# Patient Record
Sex: Male | Born: 1995
Health system: Southern US, Community
[De-identification: ages and names within clinical notes are randomized; demographics above are authoritative.]

## PROBLEM LIST (undated history)

## (undated) DIAGNOSIS — F419 Anxiety disorder, unspecified: Secondary | ICD-10-CM

## (undated) HISTORY — PX: OTHER SURGICAL HISTORY: SHX169

## (undated) HISTORY — PX: TOOTH EXTRACTION: SUR596

---
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
## 2016-06-10 DIAGNOSIS — J069 Acute upper respiratory infection, unspecified: Secondary | ICD-10-CM | POA: Diagnosis not present

## 2016-06-10 DIAGNOSIS — J020 Streptococcal pharyngitis: Secondary | ICD-10-CM | POA: Diagnosis not present

## 2016-07-13 DIAGNOSIS — Z0001 Encounter for general adult medical examination with abnormal findings: Secondary | ICD-10-CM | POA: Diagnosis not present

## 2017-03-04 ENCOUNTER — Emergency Department (HOSPITAL_COMMUNITY): Payer: 59

## 2017-03-04 ENCOUNTER — Other Ambulatory Visit: Payer: Self-pay

## 2017-03-04 ENCOUNTER — Encounter (HOSPITAL_COMMUNITY): Payer: Self-pay

## 2017-03-04 ENCOUNTER — Emergency Department (HOSPITAL_COMMUNITY)
Admission: EM | Admit: 2017-03-04 | Discharge: 2017-03-04 | Disposition: A | Discharge status: Home / Self Care | Payer: 59 | Attending: Emergency Medicine | Admitting: Emergency Medicine

## 2017-03-04 DIAGNOSIS — L03311 Cellulitis of abdominal wall: Secondary | ICD-10-CM | POA: Diagnosis not present

## 2017-03-04 DIAGNOSIS — K429 Umbilical hernia without obstruction or gangrene: Secondary | ICD-10-CM | POA: Diagnosis not present

## 2017-03-04 DIAGNOSIS — R198 Other specified symptoms and signs involving the digestive system and abdomen: Secondary | ICD-10-CM | POA: Diagnosis not present

## 2017-03-04 DIAGNOSIS — K420 Umbilical hernia with obstruction, without gangrene: Secondary | ICD-10-CM | POA: Diagnosis not present

## 2017-03-04 DIAGNOSIS — R1033 Periumbilical pain: Secondary | ICD-10-CM | POA: Diagnosis not present

## 2017-03-04 LAB — COMPREHENSIVE METABOLIC PANEL
ALT: 46 U/L (ref 17–63)
AST: 30 U/L (ref 15–41)
Albumin: 4.9 g/dL (ref 3.5–5.0)
Alkaline Phosphatase: 62 U/L (ref 38–126)
Anion gap: 9 (ref 5–15)
BUN: 14 mg/dL (ref 6–20)
CO2: 27 mmol/L (ref 22–32)
Calcium: 9.7 mg/dL (ref 8.9–10.3)
Chloride: 101 mmol/L (ref 101–111)
Creatinine, Ser: 0.9 mg/dL (ref 0.61–1.24)
GFR calc Af Amer: >60 mL/min (ref >60)
GFR calc non Af Amer: >60 mL/min (ref >60)
Glucose, Bld: 103 mg/dL — ABNORMAL HIGH (ref 65–99)
Potassium: 4.2 mmol/L (ref 3.5–5.1)
Sodium: 137 mmol/L (ref 135–145)
Total Bilirubin: 0.4 mg/dL (ref 0.3–1.2)
Total Protein: 8 g/dL (ref 6.5–8.1)

## 2017-03-04 LAB — CBC
HCT: 46.3 % (ref 39.0–52.0)
Hemoglobin: 16 g/dL (ref 13.0–17.0)
MCH: 29.3 pg (ref 26.0–34.0)
MCHC: 34.6 g/dL (ref 30.0–36.0)
MCV: 84.6 fL (ref 78.0–100.0)
Platelets: 286 10*3/uL (ref 150–400)
RBC: 5.47 MIL/uL (ref 4.22–5.81)
RDW: 12.1 % (ref 11.5–15.5)
WBC: 9.3 10*3/uL (ref 4.0–10.5)

## 2017-03-04 LAB — LIPASE, BLOOD: LIPASE: 21 U/L (ref 11–51)

## 2017-03-04 MED ORDER — IOPAMIDOL (ISOVUE-300) INJECTION 61%
INTRAVENOUS | Status: AC
  Filled 2017-03-04: qty 100

## 2017-03-04 MED ORDER — IOPAMIDOL (ISOVUE-300) INJECTION 61%
100.0000 mL | Freq: Once | INTRAVENOUS | Status: AC | PRN
  Administered 2017-03-04: 100 mL via INTRAVENOUS

## 2017-03-04 MED ORDER — SULFAMETHOXAZOLE-TRIMETHOPRIM 800-160 MG PO TABS: 1.0000 | ORAL_TABLET | Freq: Two times a day (BID) | ORAL | 0 refills | Status: AC

## 2017-03-04 NOTE — ED Provider Notes (Signed)
[facility]-EMERGENCY DEPT Provider Note   CSN: [id number] Arrival date & time: 03/04/17  1742     History   Chief Complaint Chief Complaint  Patient presents with  . Abdominal Pain    HPI Logan Ramos is a 21 y.o. male who presents to the emergency department with a chief complaint of peri-umbilical pain that began 3 days ago that is aggravated with sitting up, coughing, and palpation. He reports purulent drainage from the belly button that began tonight [facility]. He reports his symptoms are improved with rest and staying in the same position. He denies fever, chills, N/V/D, back pain, dysuria, frequency, urgency, penile discharge or swelling. No h/o of abdominal surgery. No h/o of a hernia.   He reports frequent coughing over the last few weeks prior to the onset of symptoms.  The history is provided by the patient. No language interpreter was used.  Abdominal Pain   This is a new problem. The current episode started more than 2 days ago. The problem occurs constantly. The problem has been gradually worsening. The pain is associated with an unknown factor. The pain is located in the periumbilical region. Pertinent negatives include fever, diarrhea, nausea, vomiting, constipation, dysuria, frequency and hematuria. The symptoms are aggravated by certain positions and coughing. The symptoms are relieved by certain positions and being still. Past workup does not include GI consult, CT scan, ultrasound, surgery or barium enema. His past medical history does not include PUD, gallstones, GERD, ulcerative colitis, Crohn's disease or irritable bowel syndrome.    History reviewed. No pertinent past medical history.  There are no active problems to display for this patient.   Past Surgical History:  Procedure Laterality Date  . wisdon teeth extraction         Home Medications    Prior to Admission medications   Medication Sig Start Date  End Date Taking? Authorizing Provider  sulfamethoxazole-trimethoprim (BACTRIM DS,SEPTRA DS) 800-160 MG tablet Take 1 tablet 2 (two) times daily for 7 days by mouth. 03/04/17 03/11/17  Deandre Brannan A, PA-C    Family History History reviewed. No pertinent family history.  Social History Social History   Tobacco Use  . Smoking status: Never Smoker  . Smokeless tobacco: Never Used  Substance Use Topics  . Alcohol use: Yes    Comment: occasionally  . Drug use: No     Allergies   Patient has no known allergies.   Review of Systems Review of Systems  Constitutional: Negative for activity change, appetite change, chills and fever.  Respiratory: Negative for shortness of breath.   Cardiovascular: Negative for chest pain.  Gastrointestinal: Positive for abdominal pain. Negative for abdominal distention, anal bleeding, blood in stool, constipation, diarrhea, nausea and vomiting.  Genitourinary: Negative for discharge, dysuria, enuresis, flank pain, frequency, hematuria, penile pain, penile swelling and urgency.  Musculoskeletal: Negative for back pain and neck pain.  Skin: Negative for rash.  Allergic/Immunologic: Negative for immunocompromised state.  Neurological: Negative for dizziness, weakness and light-headedness.  Hematological: Does not bruise/bleed easily.  Psychiatric/Behavioral: Negative for confusion.   Physical Exam Updated Vital Signs BP 116/81 (BP Location: Right Arm)   Pulse 86   Temp 98.1 F (36.7 C) (Oral)   Resp 16   Ht 5\' 8"  (1.727 m)   Wt 95.3 kg (210 lb)   SpO2 98%   BMI 31.93 kg/m   Physical Exam  Constitutional: He is oriented to person, place, and time. He appears  well-developed.  HENT:  Head: Normocephalic.  Eyes: Conjunctivae are normal.  Neck: Neck supple.  Cardiovascular: Normal rate, regular rhythm and normal heart sounds. Exam reveals no gallop and no friction rub.  No murmur heard. Pulmonary/Chest: Effort normal. No stridor. No  respiratory distress. He has no wheezes. He has no rhonchi. He has no rales. He exhibits no tenderness.  Abdominal: Soft. Bowel sounds are normal. He exhibits no shifting dullness, no distension, no pulsatile liver, no fluid wave, no abdominal bruit, no ascites, no pulsatile midline mass and no mass. There is no splenomegaly or hepatomegaly. There is tenderness in the periumbilical area. There is no rigidity, no rebound, no guarding, no CVA tenderness, no tenderness at McBurney's point and negative Murphy's sign.  Mucopurulent drainage is noted within the umbilicus.  At the base of the umbilicus, there is a questionable small loop of bowel that is erythematous appearing and painful to palpation.  No rebound or guarding.   Neurological: He is alert and oriented to person, place, and time.  Skin: Skin is warm and dry.  Psychiatric: His behavior is normal.  Nursing note and vitals reviewed.   ED Treatments / Results  Labs (all labs ordered are listed, but only abnormal results are displayed) Labs Reviewed  COMPREHENSIVE METABOLIC PANEL - Abnormal; Notable for the following components:      Result Value   Glucose, Bld 103 (*)    All other components within normal limits  CBC  LIPASE, BLOOD    EKG  EKG Interpretation None       Radiology Ct Abdomen Pelvis W Contrast  Result Date: 03/04/2017 CLINICAL DATA:  21 year old male complaining of mid abdominal and periumbilical pain for the past 3-5 days. Cough for the past 3-4 days. Serosanguineous drainage from the navel. EXAM: CT ABDOMEN AND PELVIS WITH CONTRAST TECHNIQUE: Multidetector CT imaging of the abdomen and pelvis was performed using the standard protocol following bolus administration of intravenous contrast. CONTRAST:  100mL ISOVUE-300 IOPAMIDOL (ISOVUE-300) INJECTION 61% COMPARISON:  No priors. FINDINGS: Lower chest: Unremarkable. Hepatobiliary: No suspicious cystic or solid hepatic lesions. No intra or extrahepatic biliary ductal  dilatation. Gallbladder is normal in appearance. Pancreas: No pancreatic mass. No pancreatic ductal dilatation. No pancreatic or peripancreatic fluid or inflammatory changes. Spleen: Unremarkable. Adrenals/Urinary Tract: Bilateral kidneys and bilateral adrenal glands are normal in appearance. No hydroureteronephrosis. Urinary bladder is normal in appearance. Stomach/Bowel: Normal appearance of the stomach. No pathologic dilatation of small bowel or colon. Normal appendix. Vascular/Lymphatic: No significant atherosclerotic disease, aneurysm or dissection noted in the abdominal or pelvic vasculature. No lymphadenopathy noted in the abdomen or pelvis. Reproductive: Prostate gland seminal vesicles are unremarkable in appearance. Other: Mild soft tissue thickening superficially in the region of the umbilicus, without well-defined fluid collection. No significant volume of ascites. No pneumoperitoneum. Musculoskeletal: There are no aggressive appearing lytic or blastic lesions noted in the visualized portions of the skeleton. IMPRESSION: 1. Minimal soft tissue thickening noted in the region of the umbilicus could suggest focal cellulitis. No other acute findings are noted in the abdomen or pelvis to account for the patient's symptoms. 2. Specifically, the appendix is normal. Electronically Signed   By: Trudie Reedaniel  Entrikin M.D.   On: 03/04/2017 20:15    Procedures Procedures (including critical care time)  Medications Ordered in ED Medications  iopamidol (ISOVUE-300) 61 % injection 100 mL (100 mLs Intravenous Contrast Given 03/04/17 1955)     Initial Impression / Assessment and Plan / ED Course  I have reviewed the  triage vital signs and the nursing notes.  Pertinent labs & imaging results that were available during my care of the patient were reviewed by me and considered in my medical decision making (see chart for details).     21 year old presenting with periumbilical abdominal pain times 3 days.  No  constitutional symptoms, N/V/D. NAD. Patient is nontoxic, nonseptic appearing, in no apparent distress.  Patient's pain and other symptoms adequately managed in emergency department. Labs, imaging and vitals reviewed.  Patient does not meet the SIRS or Sepsis criteria.  CT abdomen pelvis consistent with abdominal wall cellulitis; no evidence of a hernia. On repeat exam patient does not have a surgical abdomen and there are no peritoneal signs.  No indication of appendicitis, bowel obstruction, bowel perforation, cholecystitis, diverticulitis.  Patient discharged home with Bactrim, given the purulent discharge from the umbilicus, and given strict instructions for follow-up with their primary care physician.  I have also discussed reasons to return immediately to the ER.  Patient expresses understanding and agrees with plan.  Final Clinical Impressions(s) / ED Diagnoses   Final diagnoses:  Cellulitis of abdominal wall    ED Discharge Orders        Ordered    sulfamethoxazole-trimethoprim (BACTRIM DS,SEPTRA DS) 800-160 MG tablet  2 times daily     03/04/17 2111       Frederik Pear A, PA-C 03/04/17 2150    Maia Plan, MD 03/04/17 2358

## 2017-03-04 NOTE — ED Notes (Signed)
Being sent [facility] by [facility], being sent over for abdominal pain-possible incarcerated hernia

## 2017-03-04 NOTE — Discharge Instructions (Addendum)
Take one tablet of Bactrim (an antibiotics once every 12 hours) for the next 7 days. Clean the area at least once daily with warm soap and water. The area may continue to drain over the next few days so you can apply a gauze dressing over the area, just make sure to change it at least once daily. Apply warm compresses for 15-20 minutes at least 3-4 times per day.  If you develop fever, chills, or if the skin on the abdomen becomes red, hot, and swollen, particularly after you have taken antibiotics for >72 hours, please return to the Emergency Department or your primary care provider for re-evaluation.   If you develop nausea, vomiting, a new kind of abdominal pain, or other concerning symptoms, please return to the Emergency Department for re-evaluation.

## 2017-03-04 NOTE — ED Triage Notes (Signed)
Patient c/o mid abdominal pain/umbilical pain x 3-5 days. Patient had been coughing x 3-4 days ago. Patient has serosangous drainage from the navel.

## 2017-03-20 ENCOUNTER — Other Ambulatory Visit: Payer: Self-pay

## 2017-03-20 ENCOUNTER — Encounter (HOSPITAL_COMMUNITY): Payer: Self-pay | Admitting: Emergency Medicine

## 2017-03-20 ENCOUNTER — Emergency Department (HOSPITAL_COMMUNITY)
Admission: EM | Admit: 2017-03-20 | Discharge: 2017-03-20 | Disposition: A | Discharge status: Home / Self Care | Payer: 59 | Attending: Emergency Medicine | Admitting: Emergency Medicine

## 2017-03-20 DIAGNOSIS — L03316 Cellulitis of umbilicus: Secondary | ICD-10-CM | POA: Diagnosis not present

## 2017-03-20 MED ORDER — DOXYCYCLINE HYCLATE 100 MG PO CAPS: 100.0000 mg | ORAL_CAPSULE | Freq: Two times a day (BID) | ORAL | 0 refills | Status: DC

## 2017-03-20 MED ORDER — MUPIROCIN CALCIUM 2 % EX CREA: 1.0000 "application " | TOPICAL_CREAM | Freq: Two times a day (BID) | CUTANEOUS | 0 refills | Status: DC

## 2017-03-20 NOTE — ED Notes (Signed)
Bed: WLPT2 Expected date:  Expected time:  Means of arrival:  Comments: 

## 2017-03-20 NOTE — ED Triage Notes (Signed)
Pt c/o of continuing "green mucous" type drainage from umbilicus. Pt states he was evaluated and dx with umbilical cellulitis, has completed the antiobiotics and patient states drainage continues, but no fever.

## 2017-03-20 NOTE — Discharge Instructions (Signed)
Continue wound care.  See your Physician for recheck in 2-3 days

## 2017-03-21 NOTE — ED Provider Notes (Signed)
[facility]-EMERGENCY DEPT Provider Note   CSN: [id number] Arrival date & time: 03/20/17  1326     History   Chief Complaint Chief Complaint  Patient presents with  . umbilical cellulitis    HPI Logan Ramos is a 21 y.o. male.  Patient reports that he was treated earlier this month for cellulitis to his umbilicus He  reports that he had redness and drainage.  he was treated with Bactrim and warm compresses and symptoms resolved.  Symptoms have reoccurred.  Pt reports he now has yellow drainage.  Pt denies fever or chills.  No swelling   The history is provided by the patient. No language interpreter was used.    History reviewed. No pertinent past medical history.  There are no active problems to display for this patient.   Past Surgical History:  Procedure Laterality Date  . wisdon teeth extraction         Home Medications    Prior to Admission medications   Medication Sig Start Date End Date Taking? Authorizing Provider  doxycycline (VIBRAMYCIN) 100 MG capsule Take 1 capsule (100 mg total) by mouth 2 (two) times daily. 03/20/17   Elson AreasSofia, Vivianne Carles K, PA-C  mupirocin cream (BACTROBAN) 2 % Apply 1 application topically 2 (two) times daily. 03/20/17   Elson AreasSofia, Strummer Canipe K, PA-C    Family History History reviewed. No pertinent family history.  Social History Social History   Tobacco Use  . Smoking status: Never Smoker  . Smokeless tobacco: Never Used  Substance Use Topics  . Alcohol use: Yes    Comment: occasionally  . Drug use: No     Allergies   Patient has no known allergies.   Review of Systems Review of Systems  All other systems reviewed and are negative.    Physical Exam Updated Vital Signs BP 133/79 (BP Location: Right Arm)   Pulse 89   Temp 98.1 F (36.7 C) (Oral)   Resp 16   SpO2 100%   Physical Exam  Constitutional: He is oriented to person, place, and time. He appears well-developed and well-nourished.  HENT:   Head: Normocephalic.  Eyes: EOM are normal.  Neck: Normal range of motion.  Pulmonary/Chest: Effort normal.  Abdominal: He exhibits no distension.  Musculoskeletal: Normal range of motion.  Redness 1cm area in umbilicus,  Small amount of yellow drainage.   Neurological: He is alert and oriented to person, place, and time.  Psychiatric: He has a normal mood and affect.  Nursing note and vitals reviewed.    ED Treatments / Results  Labs (all labs ordered are listed, but only abnormal results are displayed) Labs Reviewed - No data to display  EKG  EKG Interpretation None       Radiology No results found.  Procedures Procedures (including critical care time)  Medications Ordered in ED Medications - No data to display   Initial Impression / Assessment and Plan / ED Course  I have reviewed the triage vital signs and the nursing notes.  Pertinent labs & imaging results that were available during my care of the patient were reviewed by me and considered in my medical decision making (see chart for details).     Pt had a normal ct last visit.  I will retreat and use bactroban topical.    Final Clinical Impressions(s) / ED Diagnoses   Final diagnoses:  Cellulitis of umbilicus    ED Discharge Orders        Ordered  mupirocin cream (BACTROBAN) 2 %  2 times daily     03/20/17 1354    doxycycline (VIBRAMYCIN) 100 MG capsule  2 times daily     03/20/17 1354    An After Visit Summary was printed and given to the patient.    Elson AreasSofia, Jedrek Dinovo K, PA-C 03/21/17 1407    Gerhard MunchLockwood, Robert, MD 03/22/17 [phone]

## 2017-04-17 DIAGNOSIS — B379 Candidiasis, unspecified: Secondary | ICD-10-CM | POA: Diagnosis not present

## 2017-12-15 ENCOUNTER — Emergency Department (HOSPITAL_COMMUNITY)
Admission: EM | Admit: 2017-12-15 | Discharge: 2017-12-15 | Disposition: A | Discharge status: Home / Self Care | Payer: 59 | Attending: Emergency Medicine | Admitting: Emergency Medicine

## 2017-12-15 ENCOUNTER — Emergency Department (HOSPITAL_BASED_OUTPATIENT_CLINIC_OR_DEPARTMENT_OTHER): Payer: 59

## 2017-12-15 ENCOUNTER — Encounter (HOSPITAL_COMMUNITY): Payer: Self-pay | Admitting: Emergency Medicine

## 2017-12-15 ENCOUNTER — Other Ambulatory Visit: Payer: Self-pay

## 2017-12-15 DIAGNOSIS — M79662 Pain in left lower leg: Secondary | ICD-10-CM | POA: Diagnosis not present

## 2017-12-15 DIAGNOSIS — I824Z2 Acute embolism and thrombosis of unspecified deep veins of left distal lower extremity: Secondary | ICD-10-CM | POA: Diagnosis not present

## 2017-12-15 DIAGNOSIS — M79609 Pain in unspecified limb: Secondary | ICD-10-CM | POA: Diagnosis not present

## 2017-12-15 HISTORY — DX: Anxiety disorder, unspecified: F41.9

## 2017-12-15 MED ORDER — MELOXICAM 7.5 MG PO TABS: 15.0000 mg | ORAL_TABLET | Freq: Every day | ORAL | 0 refills | Status: AC

## 2017-12-15 NOTE — ED Triage Notes (Addendum)
Pt c/o LLE pain below knee, pt states it felt like cramping Thursday night and went away, then returned yesterday. Pt states pain worsens when walking or putting pressure on it or flexing foot, positive Homans sign. Pt was evaluated by [facility] and told to come to ED for a U/S r/o clot. Pt denies injury.

## 2017-12-15 NOTE — ED Notes (Signed)
RX X 1 GIVEN 

## 2017-12-15 NOTE — ED Provider Notes (Signed)
[facility]   CSN: [id number] Arrival date & time: 12/15/17  1151     History   Chief Complaint Chief Complaint  Patient presents with  . Leg Pain    HPI Logan Ramos is a 22 y.o. male.  HPI  22 year old male, presents with a complaint of left lower extremity pain just distal to the knee in the posterior aspect of the calf, was seen at [facility] today and recommended that he come to the hospital for an evaluation with an ultrasound based on the fact that he had ongoing pain with a positive Homans sign.  The patient states that he has been driving frequently, he does some work out of town and is almost always on the road however he is also been on a ladder for the last week at least 6 hours a day for his job.  He states that this is worse when he tries to stretch his calf, it has been somewhat intermittent but gradually worsening and not associated with any chest pain shortness of breath or fevers.  He has had no medications prior to arrival for this.  Past Medical History:  Diagnosis Date  . Anxiety     There are no active problems to display for this patient.   Past Surgical History:  Procedure Laterality Date  . TOOTH EXTRACTION    . wisdon teeth extraction          Home Medications    Prior to Admission medications   Medication Sig Start Date End Date Taking? Authorizing Provider  meloxicam (MOBIC) 7.5 MG tablet Take 2 tablets (15 mg total) by mouth daily. 12/15/17   Eber HongMiller, Canuto Kingston, MD    Family History History reviewed. No pertinent family history.  Social History Social History   Tobacco Use  . Smoking status: Never Smoker  . Smokeless tobacco: Never Used  Substance Use Topics  . Alcohol use: Yes    Comment: occasionally  . Drug use: No     Allergies   Patient has no known allergies.   Review of Systems Review of Systems  All other systems reviewed and are negative.    Physical  Exam Updated Vital Signs BP (!) 149/95 (BP Location: Right Arm)   Pulse 67   Temp 98.7 F (37.1 C) (Oral)   Resp 16   Ht 5\' 9"  (1.753 m)   Wt 93.4 kg   SpO2 99%   BMI 30.42 kg/m   Physical Exam  Constitutional: He appears well-developed and well-nourished. No distress.  HENT:  Head: Normocephalic and atraumatic.  Mouth/Throat: Oropharynx is clear and moist. No oropharyngeal exudate.  Eyes: Pupils are equal, round, and reactive to light. Conjunctivae and EOM are normal. Right eye exhibits no discharge. Left eye exhibits no discharge. No scleral icterus.  Neck: Normal range of motion. Neck supple. No JVD present. No thyromegaly present.  Cardiovascular: Normal rate, regular rhythm, normal heart sounds and intact distal pulses. Exam reveals no gallop and no friction rub.  No murmur heard. Normal pulses of the left foot  Pulmonary/Chest: Effort normal and breath sounds normal. No respiratory distress. He has no wheezes. He has no rales.  Abdominal: Soft. Bowel sounds are normal. He exhibits no distension and no mass. There is no tenderness.  Musculoskeletal: Normal range of motion. He exhibits tenderness ( prox calf ttp on the L, dec ROM 2/2 pain - normal joints - supple joints.). He exhibits no edema.  Lymphadenopathy:  He has no cervical adenopathy.  Neurological: He is alert. Coordination normal.  Skin: Skin is warm and dry. No rash noted. No erythema.  Psychiatric: He has a normal mood and affect. His behavior is normal.  Nursing note and vitals reviewed.    ED Treatments / Results  Labs (all labs ordered are listed, but only abnormal results are displayed) Labs Reviewed - No data to display  EKG None  Radiology No results found.  Procedures Procedures (including critical care time)  Medications Ordered in ED Medications - No data to display   Initial Impression / Assessment and Plan / ED Course  I have reviewed the triage vital signs and the nursing  notes.  Pertinent labs & imaging results that were available during my care of the patient were reviewed by me and considered in my medical decision making (see chart for details).    The patient is well-appearing, he is healthy, he has no risk factors for DVT other than his frequent travel, however he is also been on a ladder 6 hours a day for the last week suggesting that this may be related to more of a muscle spasm in a charley horse type pathology.  Will obtain an ultrasound, anticipate discharge if negative  US of the leg shows calf vein clot only - I had a very long discussion with the patient about the risks, benefits and alternatives of the treatment of this type of clot - he has chosen to not use the anticoagulants at this time and has opted to use NSAID, heat and increased activity to prevent propagation of the DVT.  He has agreed to return for signs of extension of DVT or PE.  Will keep out of work for 7 days.  Pt and mother are in agreement.  No signs of PE - pt stable for d/c.  Final Clinical Impressions(s) / ED Diagnoses   Final diagnoses:  Acute deep vein thrombosis (DVT) of distal vein of left lower extremity [facility])    ED Discharge Orders         Ordered    meloxicam (MOBIC) 7.5 MG tablet  Daily     12/15/17 1515           Eber Hong, MD 12/15/17 1517

## 2017-12-15 NOTE — Discharge Instructions (Signed)
Your ultrasound shows that you have a blood clot below your knee and your calf, this is very small and has not spread above the knee which means that you do not necessarily need to take blood thinners.  You have chosen to take anti-inflammatories and use hot compresses instead, please be careful and applying heat to the leg, it should be applied intermittently and wrapped in a towel.  Take Mobic, 7.5 mg twice a day for the next couple of weeks, this will give you time to follow-up with your family doctor.  If you should develop increasing pain swelling or any difficulty breathing or chest pain at all you must return to the emergency department.   Your doctor should order a repeat ultrasound of your leg to be performed within the next 2 to 3 months.  This will help monitor improvement or worsening of the clot

## 2017-12-15 NOTE — Progress Notes (Signed)
Left lower extremity venous duplex has been completed. There is evidence of acute deep vein thrombosis involving the intramuscular gastrocnemius veins of the left lower extremity. Results were given to Dr. Hyacinth MeekerMiller.   12/15/17 3:01 PM Olen CordialGreg Tiarrah Saville RVT

## 2017-12-27 DIAGNOSIS — I82402 Acute embolism and thrombosis of unspecified deep veins of left lower extremity: Secondary | ICD-10-CM | POA: Diagnosis not present

## 2018-01-09 IMAGING — CT CT ABD-PELV W/ CM
2 of 4 series · 15 of 46 positions shown, 17 images · IV contrast (ISOVUE)
Comparison: No priors.

CLINICAL DATA: 21-year-old male complaining of mid abdominal and
periumbilical pain for the past 3-5 days. Cough for the past 3-4
days. Serosanguineous drainage from the navel.

EXAM:
CT ABDOMEN AND PELVIS WITH CONTRAST
TECHNIQUE: Multidetector CT imaging of the abdomen and pelvis was performed
using the standard protocol following bolus administration of
intravenous contrast.
CONTRAST:  100mL GKRUY5-177 IOPAMIDOL (GKRUY5-177) INJECTION 61%

[Series 2: abd/pel with · axial · 0.74mm/px · z∈[-543,-48]mm · 12 of 111 slices shown, 14 images]
[im 6/111  soft-tissue]
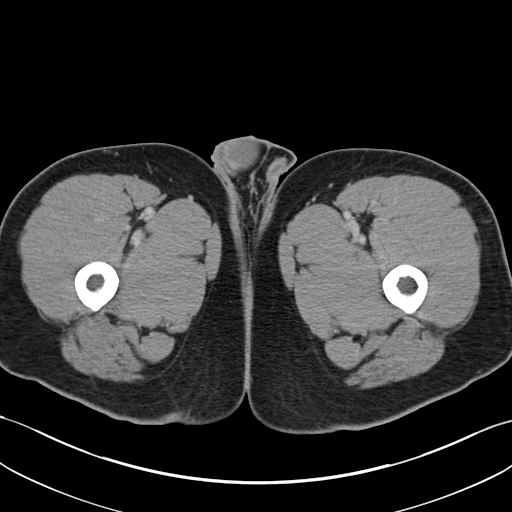
[im 6/111  bone]
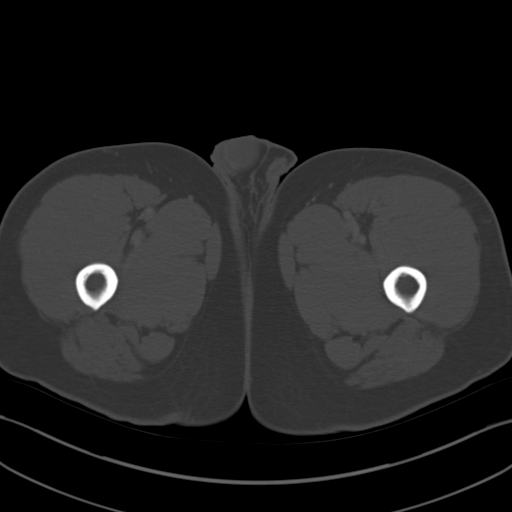
[im 16/111  soft-tissue]
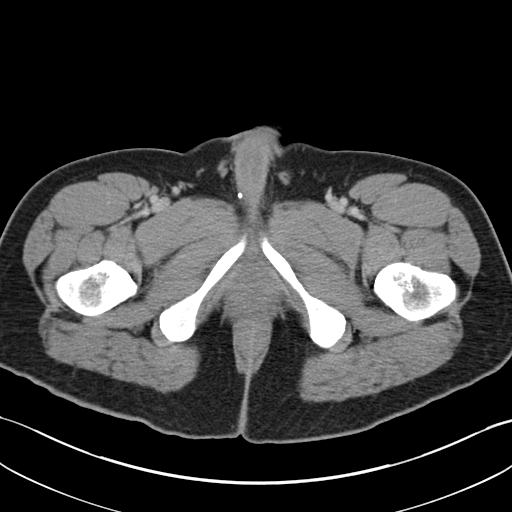
[im 27/111  soft-tissue]
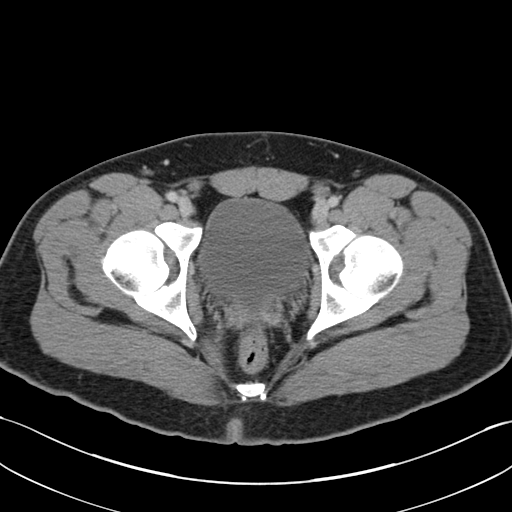
[im 32/111  soft-tissue]
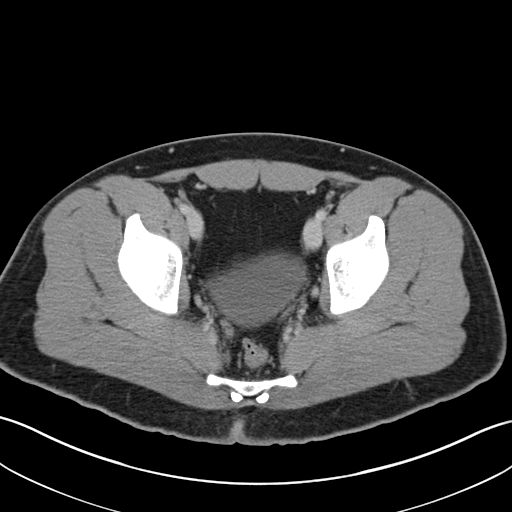
[im 42/111  soft-tissue]
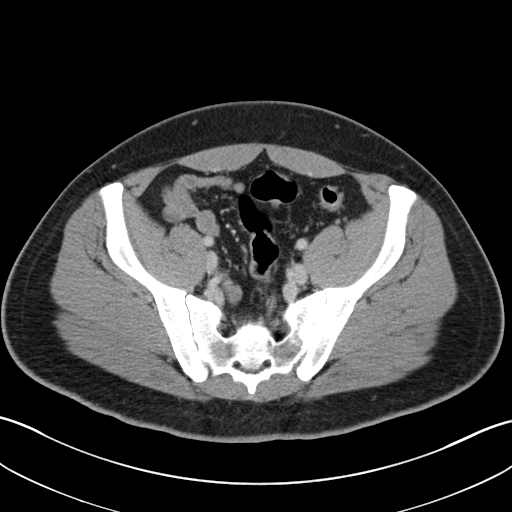
[im 53/111  soft-tissue]
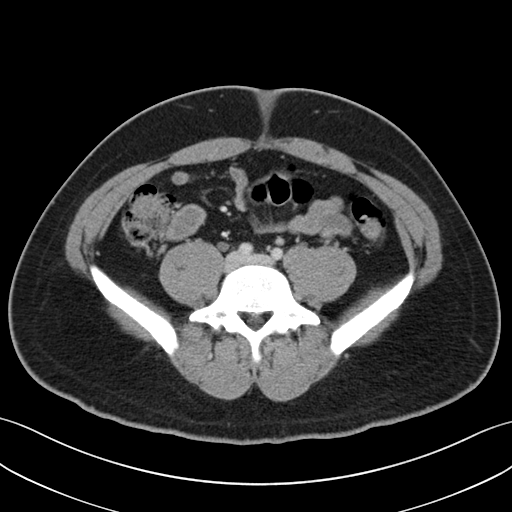
[im 58/111  soft-tissue]
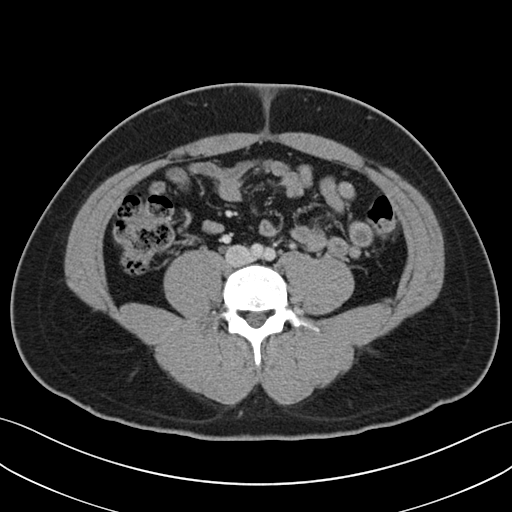
[im 69/111  soft-tissue]
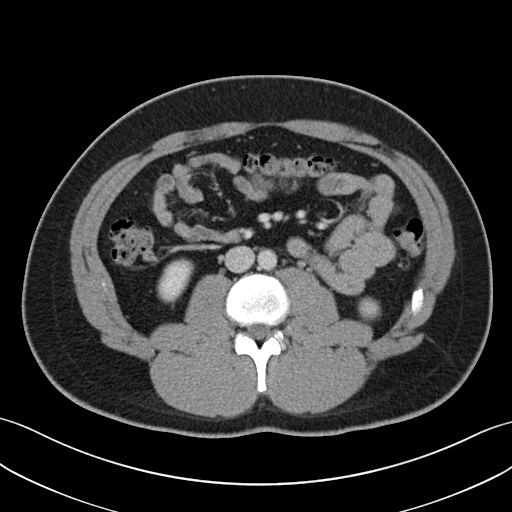
[im 79/111  soft-tissue]
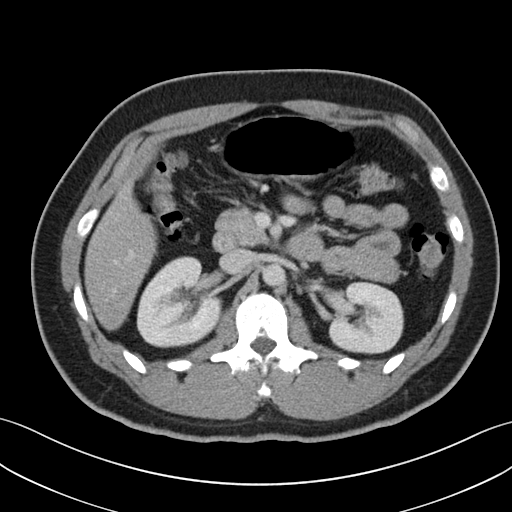
[im 79/111  bone]
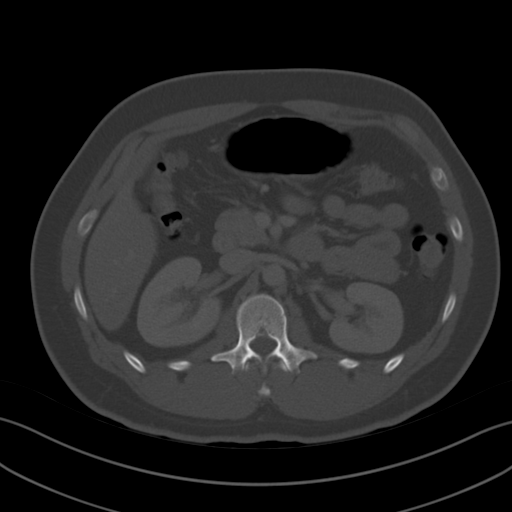
[im 84/111  soft-tissue]
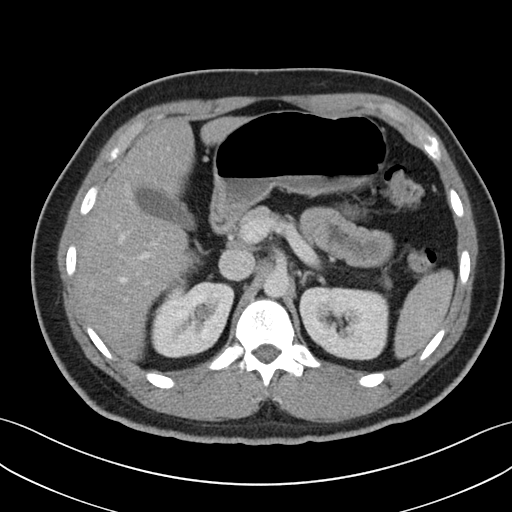
[im 95/111  soft-tissue]
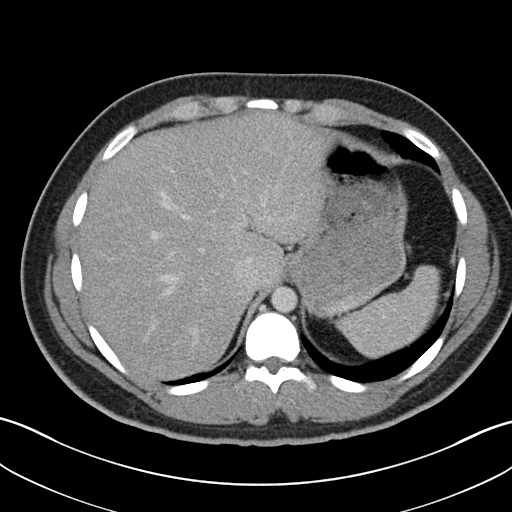
[im 105/111  soft-tissue]
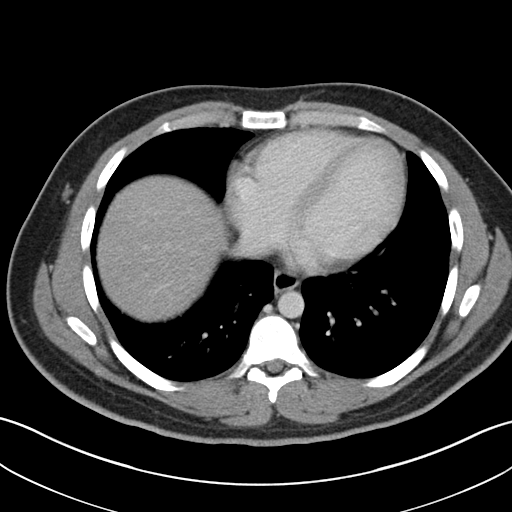

[Series 4: coronal a/|p · coronal · 0.74mm/px · 3 of 143 slices shown]
[im 48/143  soft-tissue]
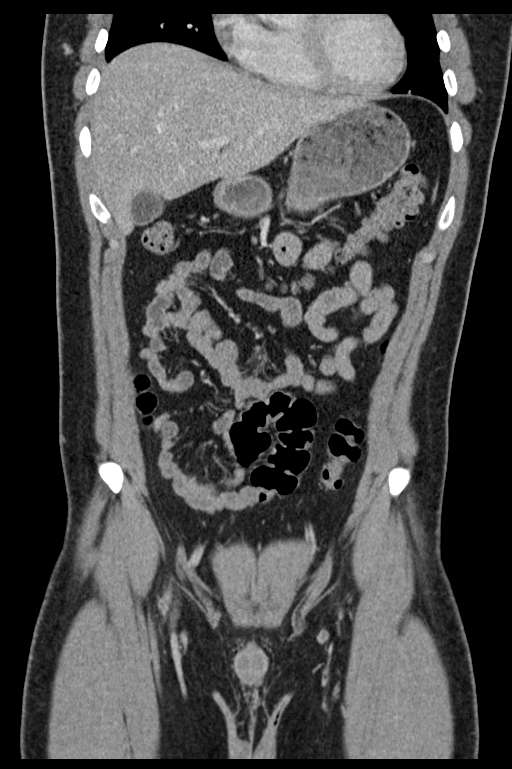
[im 64/143  soft-tissue]
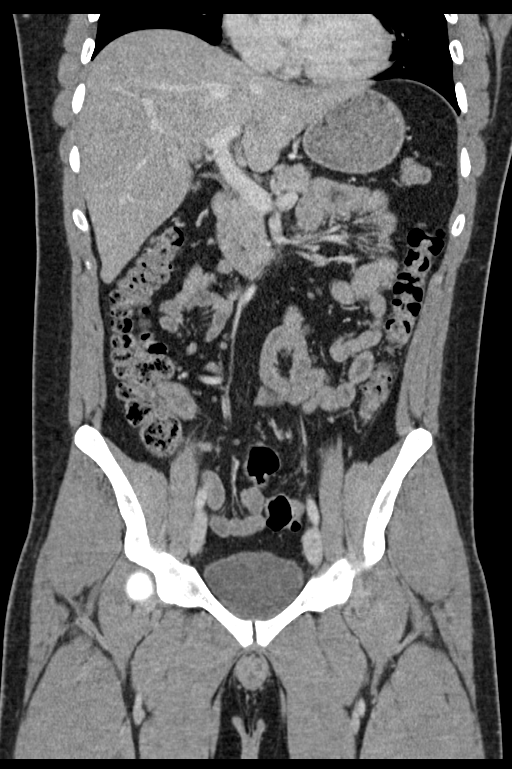
[im 79/143  soft-tissue]
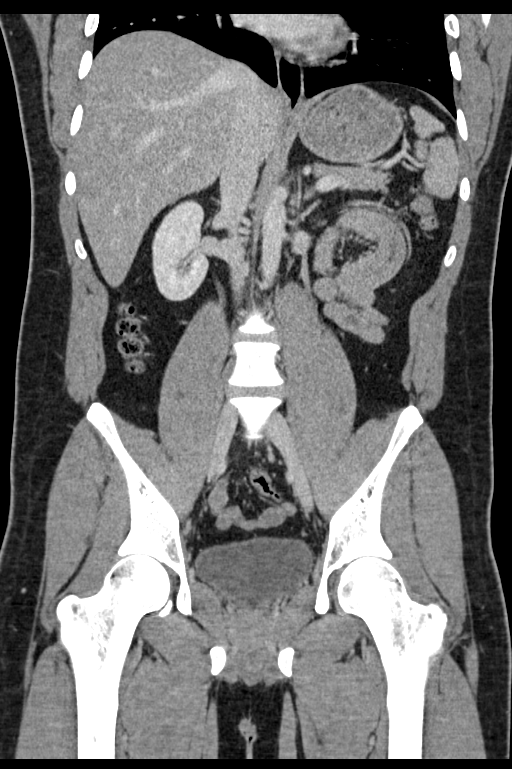

[15 of 46 positions shown; findings below may reference images not displayed]

FINDINGS: Lower chest: Unremarkable.

Hepatobiliary: No suspicious cystic or solid hepatic lesions. No
intra or extrahepatic biliary ductal dilatation. Gallbladder is
normal in appearance.

Pancreas: No pancreatic mass. No pancreatic ductal dilatation. No
pancreatic or peripancreatic fluid or inflammatory changes.

Spleen: Unremarkable.

Adrenals/Urinary Tract: Bilateral kidneys and bilateral adrenal
glands are normal in appearance. No hydroureteronephrosis. Urinary
bladder is normal in appearance.

Stomach/Bowel: Normal appearance of the stomach. No pathologic
dilatation of small bowel or colon. Normal appendix.

Vascular/Lymphatic: No significant atherosclerotic disease, aneurysm
or dissection noted in the abdominal or pelvic vasculature. No
lymphadenopathy noted in the abdomen or pelvis.

Reproductive: Prostate gland seminal vesicles are unremarkable in
appearance.

Other: Mild soft tissue thickening superficially in the region of
the umbilicus, without well-defined fluid collection. No significant
volume of ascites. No pneumoperitoneum.

Musculoskeletal: There are no aggressive appearing lytic or blastic
lesions noted in the visualized portions of the skeleton.
IMPRESSION: 1. Minimal soft tissue thickening noted in the region of the
umbilicus could suggest focal cellulitis. No other acute findings
are noted in the abdomen or pelvis to account for the patient's
symptoms.
2. Specifically, the appendix is normal.

## 2018-01-28 ENCOUNTER — Other Ambulatory Visit: Payer: Self-pay | Admitting: Family Medicine

## 2018-01-28 DIAGNOSIS — I824Y2 Acute embolism and thrombosis of unspecified deep veins of left proximal lower extremity: Secondary | ICD-10-CM

## 2018-02-05 ENCOUNTER — Ambulatory Visit
Admission: RE | Admit: 2018-02-05 | Discharge: 2018-02-05 | Disposition: A | Discharge status: Home / Self Care | Payer: 59 | Source: Ambulatory Visit | Attending: Family Medicine | Admitting: Family Medicine

## 2018-02-05 DIAGNOSIS — I82492 Acute embolism and thrombosis of other specified deep vein of left lower extremity: Secondary | ICD-10-CM | POA: Diagnosis not present

## 2018-02-05 DIAGNOSIS — I824Y2 Acute embolism and thrombosis of unspecified deep veins of left proximal lower extremity: Secondary | ICD-10-CM

## 2018-03-05 DIAGNOSIS — H52221 Regular astigmatism, right eye: Secondary | ICD-10-CM | POA: Diagnosis not present

## 2020-01-05 IMAGING — US US EXTREM LOW VENOUS*L*
1 series · 13 of 24 positions shown · non-contrast
Comparison: None.

CLINICAL DATA: Previous DVT involving the left gastrocnemius vein.
Evaluate for acute or chronic DVT.



[Series 1: us extrem low venous*left* · 0.06mm/px · 13 of 32 slices shown]
[im 1/32]
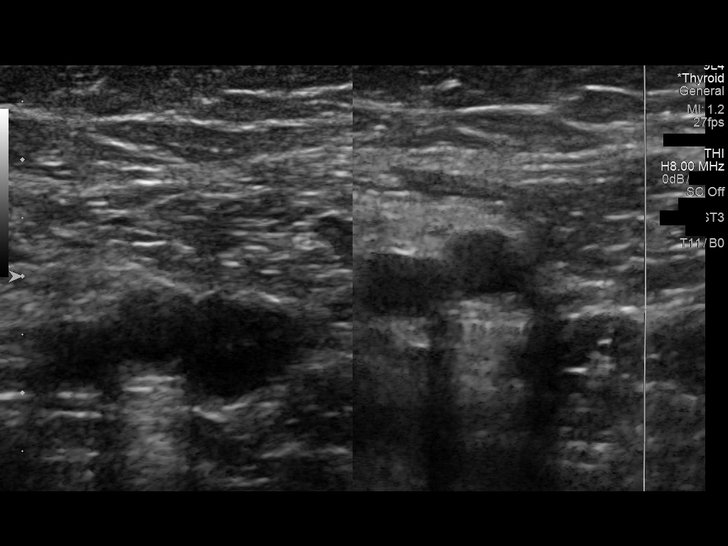
[im 3/32]
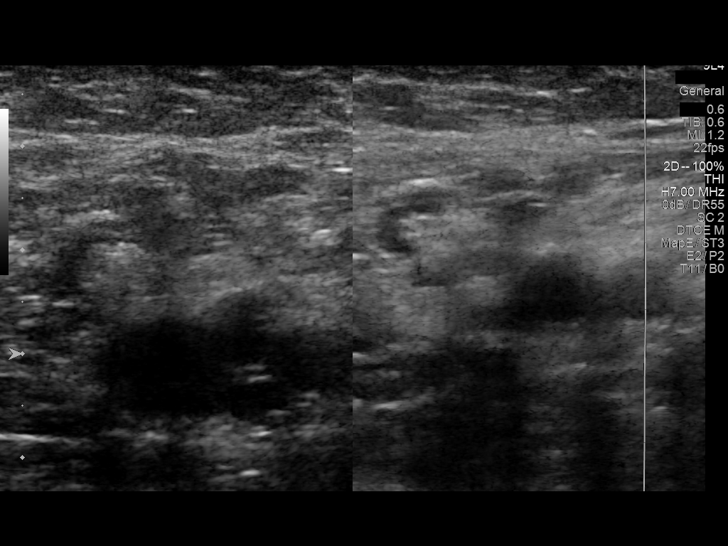
[im 6/32]
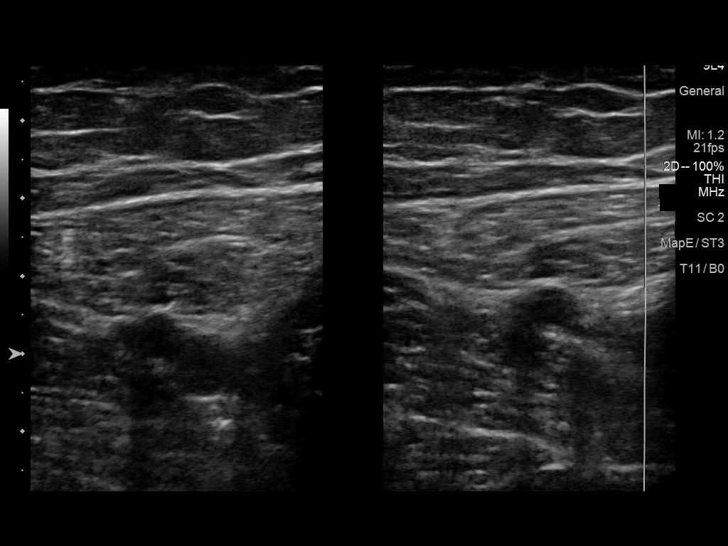
[im 9/32]
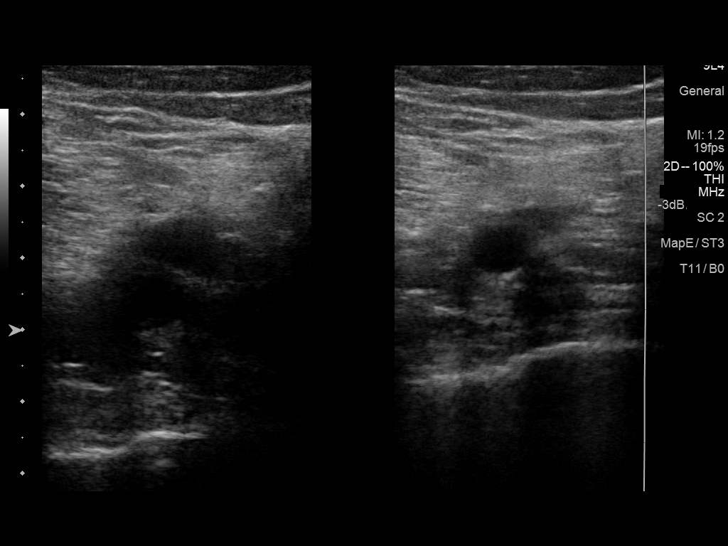
[im 11/32]
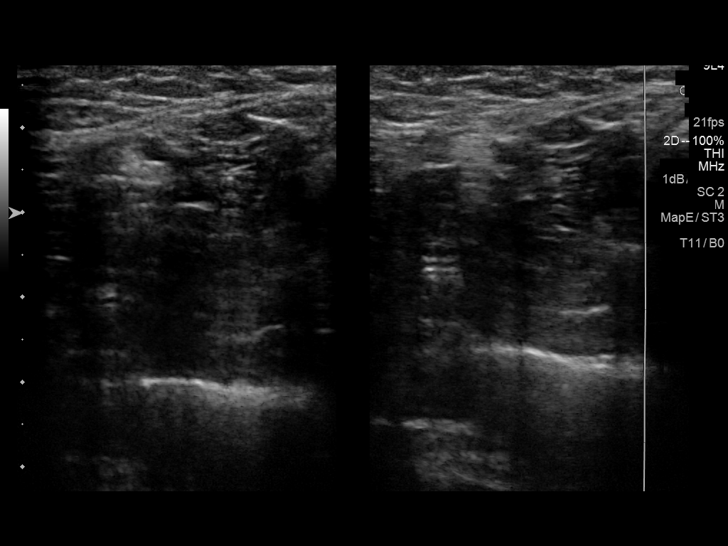
[im 14/32]
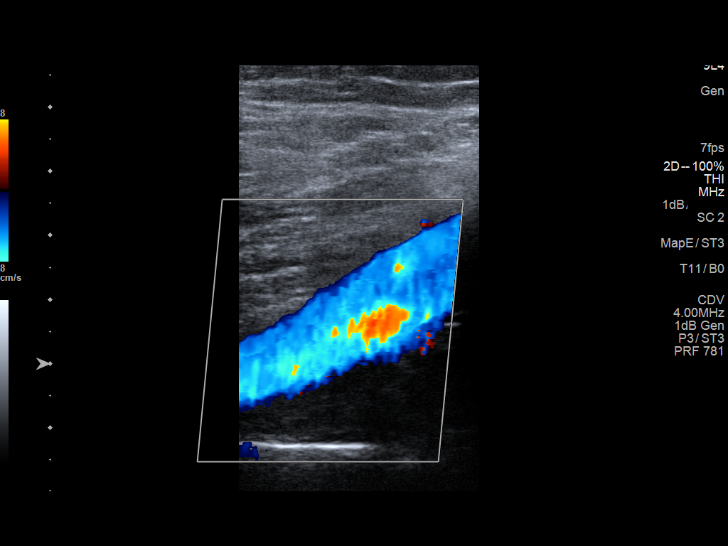
[im 17/32]
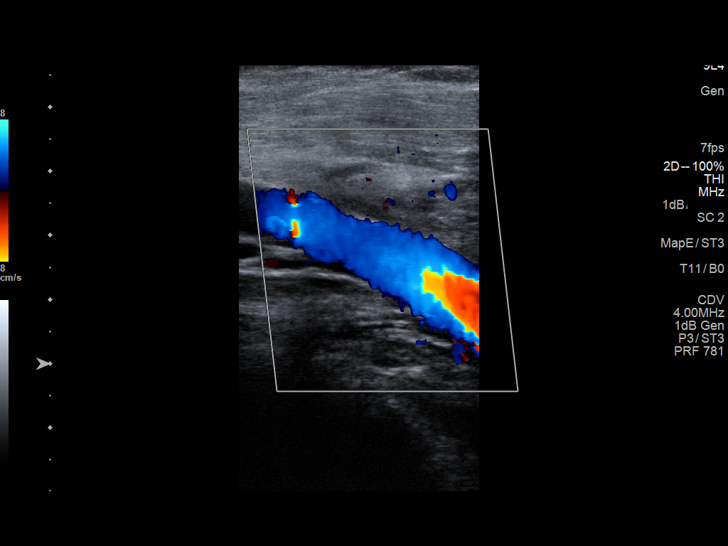
[im 18/32]
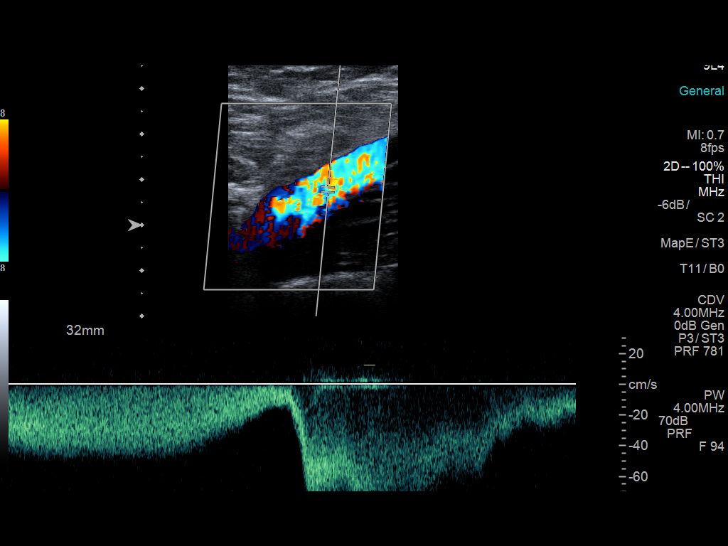
[im 21/32]
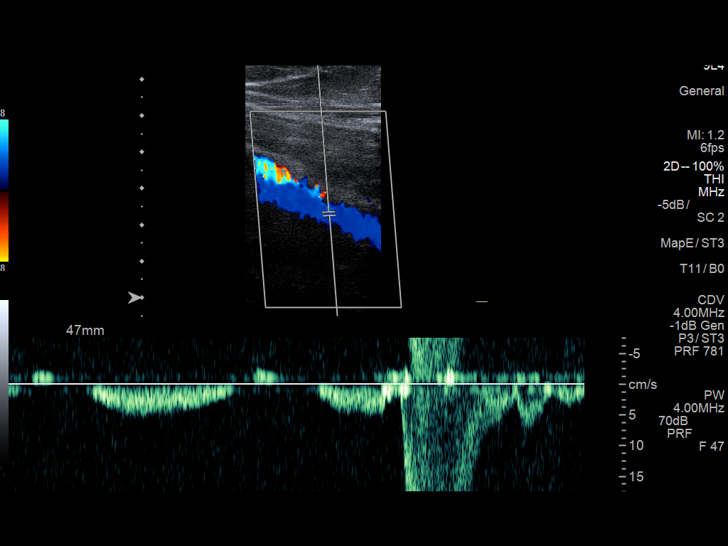
[im 23/32]
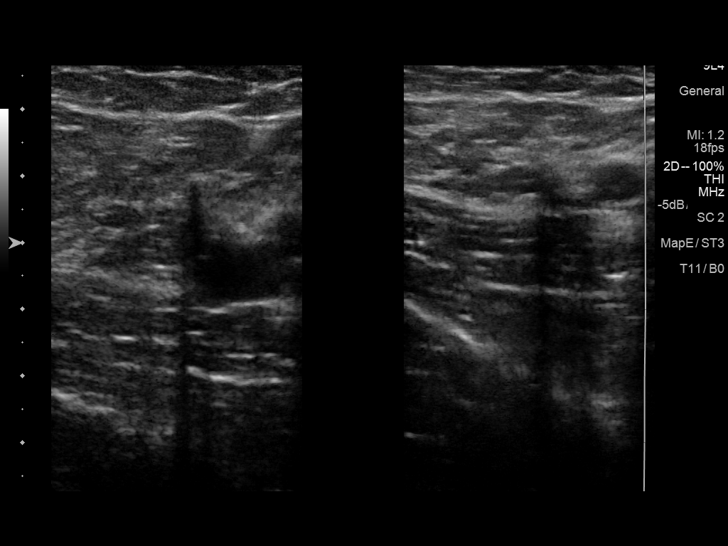
[im 26/32]
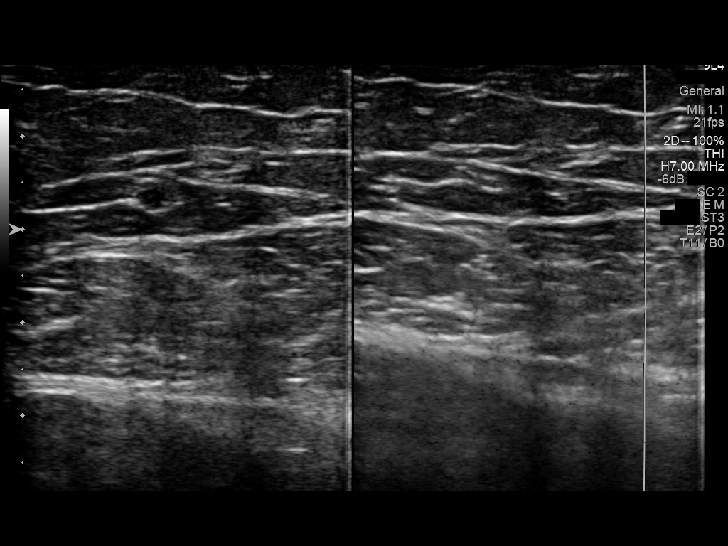
[im 29/32]
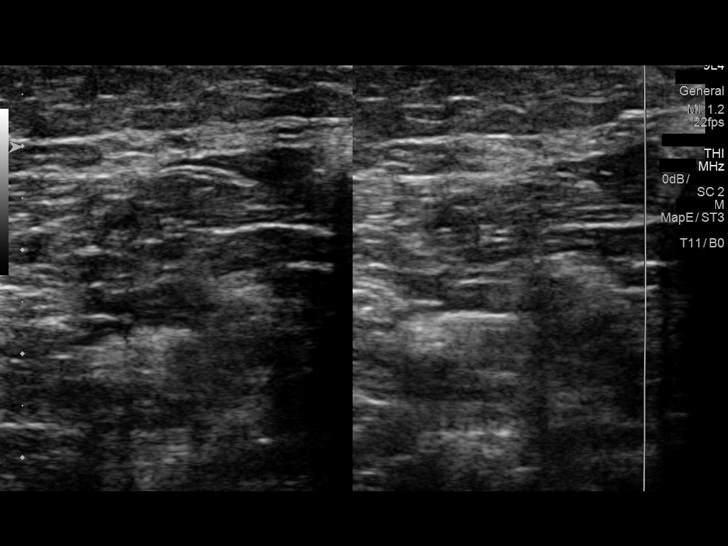
[im 32/32]
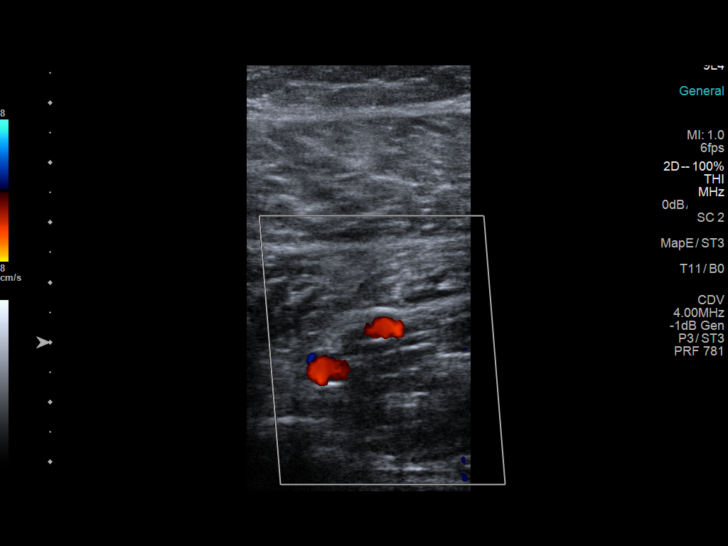

[13 of 24 positions shown; findings below may reference images not displayed]

FINDINGS: Contralateral Common Femoral Vein: Respiratory phasicity is normal
and symmetric with the symptomatic side. No evidence of thrombus.
Normal compressibility.

Common Femoral Vein: No evidence of thrombus. Normal
compressibility, respiratory phasicity and response to augmentation.

Saphenofemoral Junction: No evidence of thrombus. Normal
compressibility and flow on color Doppler imaging.

Profunda Femoral Vein: No evidence of thrombus. Normal
compressibility and flow on color Doppler imaging.

Femoral Vein: No evidence of thrombus. Normal compressibility,
respiratory phasicity and response to augmentation.

Popliteal Vein: No evidence of thrombus. Normal compressibility,
respiratory phasicity and response to augmentation.

Calf Veins: No evidence of thrombus. Normal compressibility and flow
on color Doppler imaging.

Superficial Great Saphenous Vein: No evidence of thrombus. Normal
compressibility.

Venous Reflux:  None.

Other Findings: Left gastrocnemius vein appears patent where imaged
(images 33 and 34).
IMPRESSION: No evidence of acute or chronic DVT/SVT within the left lower
extremity with special attention paid to the left gastrocnemius
vein.

## 2023-02-19 ENCOUNTER — Encounter (HOSPITAL_COMMUNITY): Payer: Self-pay | Admitting: Emergency Medicine
